# Patient Record
Sex: Female | Born: 1970 | Race: White | Hispanic: No | Marital: Married | Smoking: Former smoker
Health system: Southern US, Community
[De-identification: ages and names within clinical notes are randomized; demographics above are authoritative.]

## PROBLEM LIST (undated history)

## (undated) DIAGNOSIS — F419 Anxiety disorder, unspecified: Secondary | ICD-10-CM

## (undated) DIAGNOSIS — G8929 Other chronic pain: Secondary | ICD-10-CM

## (undated) DIAGNOSIS — E222 Syndrome of inappropriate secretion of antidiuretic hormone: Secondary | ICD-10-CM

## (undated) DIAGNOSIS — E079 Disorder of thyroid, unspecified: Secondary | ICD-10-CM

## (undated) DIAGNOSIS — Z1379 Encounter for other screening for genetic and chromosomal anomalies: Secondary | ICD-10-CM

## (undated) HISTORY — DX: Other chronic pain: G89.29

## (undated) HISTORY — DX: Encounter for other screening for genetic and chromosomal anomalies: Z13.79

## (undated) HISTORY — DX: Anxiety disorder, unspecified: F41.9

## (undated) HISTORY — DX: Disorder of thyroid, unspecified: E07.9

## (undated) HISTORY — DX: Syndrome of inappropriate secretion of antidiuretic hormone: E22.2

---

## 2001-06-11 HISTORY — PX: TUBAL LIGATION: SHX77

## 2010-10-31 ENCOUNTER — Ambulatory Visit: Payer: Self-pay | Admitting: Internal Medicine

## 2010-12-10 ENCOUNTER — Emergency Department: Payer: Self-pay | Admitting: Emergency Medicine

## 2011-02-27 ENCOUNTER — Ambulatory Visit: Payer: Self-pay

## 2011-12-08 ENCOUNTER — Ambulatory Visit: Payer: Self-pay | Admitting: Family Medicine

## 2011-12-11 ENCOUNTER — Emergency Department: Payer: Self-pay | Admitting: Emergency Medicine

## 2011-12-11 LAB — URINALYSIS, COMPLETE
Bilirubin,UR: NEGATIVE
Ketone: NEGATIVE
Leukocyte Esterase: NEGATIVE
Nitrite: NEGATIVE
Protein: NEGATIVE
WBC UR: NONE SEEN /HPF (ref 0–5)

## 2011-12-11 LAB — COMPREHENSIVE METABOLIC PANEL
Alkaline Phosphatase: 86 U/L (ref 50–136)
Bilirubin,Total: 0.2 mg/dL (ref 0.2–1.0)
Calcium, Total: 8.6 mg/dL (ref 8.5–10.1)
Co2: 27 mmol/L (ref 21–32)
EGFR (African American): >60
EGFR (Non-African Amer.): >60
Potassium: 3.8 mmol/L (ref 3.5–5.1)
SGPT (ALT): 25 U/L
Sodium: 142 mmol/L (ref 136–145)
Total Protein: 6.9 g/dL (ref 6.4–8.2)

## 2011-12-11 LAB — CBC
HGB: 12.8 g/dL (ref 12.0–16.0)
MCH: 31.3 pg (ref 26.0–34.0)
MCHC: 33.7 g/dL (ref 32.0–36.0)
RDW: 13.4 % (ref 11.5–14.5)
WBC: 4.9 10*3/uL (ref 3.6–11.0)

## 2012-11-24 ENCOUNTER — Ambulatory Visit: Payer: Self-pay | Admitting: Family Medicine

## 2013-01-08 ENCOUNTER — Ambulatory Visit: Payer: Self-pay | Admitting: Family Medicine

## 2013-01-22 ENCOUNTER — Ambulatory Visit: Payer: Self-pay | Admitting: Family Medicine

## 2013-04-27 ENCOUNTER — Ambulatory Visit: Payer: Self-pay | Admitting: Physician Assistant

## 2013-07-11 DIAGNOSIS — Z1379 Encounter for other screening for genetic and chromosomal anomalies: Secondary | ICD-10-CM

## 2013-07-11 HISTORY — DX: Encounter for other screening for genetic and chromosomal anomalies: Z13.79

## 2013-10-20 ENCOUNTER — Ambulatory Visit: Payer: Self-pay | Admitting: Family Medicine

## 2014-04-22 ENCOUNTER — Ambulatory Visit: Payer: Self-pay | Admitting: Family Medicine

## 2014-05-19 ENCOUNTER — Ambulatory Visit: Payer: Self-pay | Admitting: General Surgery

## 2014-05-19 ENCOUNTER — Encounter: Payer: Self-pay | Admitting: *Deleted

## 2014-05-27 ENCOUNTER — Ambulatory Visit (INDEPENDENT_AMBULATORY_CARE_PROVIDER_SITE_OTHER): Payer: 59 | Admitting: General Surgery

## 2014-05-27 ENCOUNTER — Encounter: Payer: Self-pay | Admitting: General Surgery

## 2014-05-27 VITALS — BP 130/70 | HR 74 | Resp 14 | Ht 63.0 in | Wt 224.0 lb

## 2014-05-27 DIAGNOSIS — Z803 Family history of malignant neoplasm of breast: Secondary | ICD-10-CM

## 2014-05-27 NOTE — Progress Notes (Signed)
Patient ID: Donna Yoder, female   DOB: 03/20/1971, 43 y.o.   MRN: [id number]  Chief Complaint  Patient presents with  . Other    mammogram     HPI Donna Yoder is a 43 y.o. female who presents for a breast evaluation. The most recent mammogram was done on 04/22/14. Patient does perform regular self breast checks and gets regular mammograms done. The patient states she has been genetically tested and tested positive for both BRCA 1 and 2 but she has no record of it. By report this was completed in [place], [place] in the 1990s. She thinks she has a very strong family history of breast cancer, reporting her sister, mother, maternal aunt and maternal grandmother have had breast cancer.  On further questioning, her sister had a mass the size of a lemon removed from her breast at age 19. She received no additional treatment, chemotherapy, radiation or hormonal therapy and is well her 40s. 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 The best I can tell, her mother had breast cancer in her 34s, and her maternal grandmother had cancer in her 75s.    The patient reports that she had the onset of menarche at age 72 and had her first live birth at age 61.  The patient is accompanied by her wife, Crystal.     HPI  Past Medical History  Diagnosis Date  . ADH disorder   . Anxiety   . Thyroid disease   . Chronic pain     Past Surgical History  Procedure Laterality Date  . Tubal ligation  2003    Family History  Problem Relation Age of Onset  . Cancer Mother 2    breast  . Cancer Father     prostate  . Cancer Maternal Aunt 59    breast  . Cancer Maternal Grandmother 70    breast    Social History History  Substance Use Topics  . Smoking status: Former Smoker -- 1.00 packs/day for 21 years  . Smokeless tobacco: Never Used  . Alcohol Use: Yes    Allergies  Allergen Reactions  . Ivp Dye [Iodinated Diagnostic  Agents] Anaphylaxis  . Latex Rash    Current Outpatient Prescriptions  Medication Sig Dispense Refill  . ALPRAZolam (XANAX) 1 MG tablet   1  . Ascorbic Acid (VITAMIN C PO) Take by mouth.    . B Complex Vitamins (VITAMIN B COMPLEX PO) Take by mouth.    . Calcium Carbonate-Vitamin D (CALCIUM + D PO) Take by mouth.    . docusate sodium (COLACE) 100 MG capsule Take 100 mg by mouth 2 (two) times daily.    . ferrous sulfate 325 (65 FE) MG tablet Take 325 mg by mouth daily with breakfast.    . Lactobacillus (ACIDOPHILUS PO) Take by mouth.    . levothyroxine (SYNTHROID, LEVOTHROID) 50 MCG tablet Take 50 mcg by mouth daily.  11  . methylphenidate (RITALIN) 20 MG tablet Take 20 mg by mouth 3 (three) times daily.  0  . Multiple Vitamins-Minerals (ZINC PO) Take by mouth.    . Omega-3 Fatty Acids (FP FISH OIL PO) Take by mouth.    . oxyCODONE-acetaminophen (PERCOCET/ROXICET) 5-325 MG per tablet   0   No current facility-administered medications for this visit.    Review of Systems Review of Systems  Constitutional: Negative.   Respiratory: Negative.   Cardiovascular: Negative.     Blood  pressure 130/70, pulse 74, resp. rate 14, height 5' 3"  (1.6 m), weight 224 lb (101.606 kg), last menstrual period 05/25/2014.  Physical Exam Physical Exam  Constitutional: She is oriented to person, place, and time. She appears well-developed and well-nourished.  Neck: Neck supple. No thyromegaly present.  Cardiovascular: Normal rate, regular rhythm and normal heart sounds.   No murmur heard. Pulmonary/Chest: Effort normal and breath sounds normal. Right breast exhibits no inverted nipple, no mass, no nipple discharge, no skin change and no tenderness. Left breast exhibits no inverted nipple, no mass, no nipple discharge, no skin change and no tenderness.  Lymphadenopathy:    She has no cervical adenopathy.    She has no axillary adenopathy.  Neurological: She is alert and oriented to person, place, and  time.  Skin: Skin is warm and dry.    Data Reviewed Most recent bilateral diagnostic mammogram of 04/22/2014 showed "no suspicious masses, calcifications or other abnormalities is identified within either breasts". BI-RADS-3 based on history of BRCA positive. Recommendation for consideration of MRI.  Assessment    Benign breast exam.  Possible BRCA-positive patient who would benefit from prophylactic mastectomy.    Plan    The patient was convinced that she wanted bilateral mastectomies with immediate reconstruction. In good conscience, I cannot consider this deforming surgery based on family history alone, anaerobic necessary to repeat her BRCA testing to confirm what she has been told in the past of being BRCA 1/2 positive. If indeed this is true, she would be a candidate for bilateral oophorectomy as well as mastectomy.  Review of her mammogram shows essentially fatty replaced breasts for which mammograms are very sensitive. Based on the [place] recommendations would not be inappropriate to do at least biannual screening MRIs if she does not proceed to mastectomy.  She is amenable to repeat testing and this would be completed next week with the nurse. She is aware that we'll be 2-3 weeks before the test results are available.    PCP:  Magnus Sinning 05/29/2014, 8:00 AM

## 2014-05-27 NOTE — Patient Instructions (Signed)
Patient to return for BRCA testing with [facility]. The patient is aware to call back for any questions or concerns.

## 2014-05-29 DIAGNOSIS — Z803 Family history of malignant neoplasm of breast: Secondary | ICD-10-CM | POA: Insufficient documentation

## 2014-06-02 ENCOUNTER — Ambulatory Visit: Payer: 59

## 2014-06-05 ENCOUNTER — Emergency Department: Payer: Self-pay | Admitting: Emergency Medicine

## 2014-06-05 LAB — URINALYSIS, COMPLETE
Bilirubin,UR: NEGATIVE
Blood: NEGATIVE
GLUCOSE, UR: NEGATIVE mg/dL (ref 0–75)
Ketone: NEGATIVE
Leukocyte Esterase: NEGATIVE
Nitrite: NEGATIVE
PH: 7 (ref 4.5–8.0)
Protein: NEGATIVE
RBC,UR: <1 /HPF (ref 0–5)
Specific Gravity: 1.019 (ref 1.003–1.030)
WBC UR: <1 /HPF (ref 0–5)

## 2014-06-05 LAB — BASIC METABOLIC PANEL
ANION GAP: 5 — AB (ref 7–16)
BUN: 16 mg/dL (ref 7–18)
CREATININE: 0.94 mg/dL (ref 0.60–1.30)
Calcium, Total: 8.5 mg/dL (ref 8.5–10.1)
Chloride: 104 mmol/L (ref 98–107)
Co2: 31 mmol/L (ref 21–32)
EGFR (Non-African Amer.): >60
Glucose: 105 mg/dL — ABNORMAL HIGH (ref 65–99)
OSMOLALITY: 281 (ref 275–301)
POTASSIUM: 4 mmol/L (ref 3.5–5.1)
Sodium: 140 mmol/L (ref 136–145)

## 2014-06-05 LAB — CBC
HCT: 40.1 % (ref 35.0–47.0)
HGB: 13.2 g/dL (ref 12.0–16.0)
MCH: 30.3 pg (ref 26.0–34.0)
MCHC: 32.9 g/dL (ref 32.0–36.0)
MCV: 92 fL (ref 80–100)
Platelet: 233 10*3/uL (ref 150–440)
RBC: 4.35 10*6/uL (ref 3.80–5.20)
RDW: 13.8 % (ref 11.5–14.5)
WBC: 12.2 10*3/uL — AB (ref 3.6–11.0)

## 2014-06-05 LAB — D-DIMER(ARMC): D-Dimer: 465 ng/ml

## 2014-06-05 LAB — TROPONIN I

## 2014-06-09 ENCOUNTER — Ambulatory Visit: Payer: Self-pay

## 2014-06-09 ENCOUNTER — Telehealth: Payer: Self-pay | Admitting: *Deleted

## 2014-06-09 ENCOUNTER — Ambulatory Visit: Payer: 59

## 2014-06-09 NOTE — Telephone Encounter (Signed)
She missed appointment for her BRCA testing.  R/S appointment ?

## 2014-06-10 ENCOUNTER — Ambulatory Visit: Payer: 59

## 2014-06-29 ENCOUNTER — Encounter: Payer: Self-pay | Admitting: General Surgery

## 2014-06-29 NOTE — Progress Notes (Signed)
BRCA testing from [facility] showed no clinically significant mutations. Patient is at average risk for breast cancer.  No indication for prophylactic mastectomy at this time.   With identification of fatty replaced breast, and a benign clinical exam, no surgical intervention is needed at this time.  The patient will be notified of the test results in the morning.  We will arrange for an office visit to review the test results into detail screening protocol in the near future.

## 2014-06-30 ENCOUNTER — Telehealth: Payer: Self-pay | Admitting: *Deleted

## 2014-06-30 NOTE — Telephone Encounter (Signed)
Notified patient as instructed, patient pleased. Discussed follow-up appointments on an as needed basis, patient agrees. She will continue her annual mammograms and breast checks with her PCP. She is aware we will be mailing her results to her, pt agrees..Marland Kitchen

## 2014-06-30 NOTE — Telephone Encounter (Signed)
-----   Message from Donna MayotteJeffrey W Byrnett, MD sent at 06/29/2014  6:30 PM EST ----- The patient is welcome to come in for an office visit to review these test results if she desires. Otherwise, annual screening mammograms are appropriate.

## 2014-07-05 ENCOUNTER — Encounter: Payer: Self-pay | Admitting: General Surgery

## 2017-01-23 ENCOUNTER — Ambulatory Visit
Admission: EM | Admit: 2017-01-23 | Discharge: 2017-01-23 | Disposition: A | Discharge status: Home / Self Care | Payer: No Typology Code available for payment source | Attending: Family Medicine | Admitting: Family Medicine

## 2017-01-23 ENCOUNTER — Encounter: Payer: Self-pay | Admitting: Emergency Medicine

## 2017-01-23 DIAGNOSIS — B349 Viral infection, unspecified: Secondary | ICD-10-CM | POA: Diagnosis not present

## 2017-01-23 DIAGNOSIS — J029 Acute pharyngitis, unspecified: Secondary | ICD-10-CM | POA: Diagnosis not present

## 2017-01-23 LAB — RAPID STREP SCREEN (MED CTR MEBANE ONLY): Streptococcus, Group A Screen (Direct): NEGATIVE

## 2017-01-23 NOTE — ED Provider Notes (Signed)
[facility]    CSN: [id number] Arrival date & time: 01/23/17  1512     History   Chief Complaint Chief Complaint  Patient presents with  . Sore Throat    HPI Nile Dorning is a 46 y.o. female.   46 yo female with a c/o sore throat since this morning. Pain with swallowing. Denies any fevers, chills, cough, earache, runny nose/congestion. 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    The history is provided by the patient.  Sore Throat  This is a new problem. The current episode started 6 to 12 hours ago. The problem occurs constantly. The problem has not changed since onset.Pertinent negatives include no chest pain, no abdominal pain, no headaches and no shortness of breath.    Past Medical History:  Diagnosis Date  . ADH disorder (HCC)   . Anxiety   . Chronic pain   . Thyroid disease     Patient Active Problem List   Diagnosis Date Noted  . Family history of breast cancer 05/29/2014    Past Surgical History:  Procedure Laterality Date  . TUBAL LIGATION  2003    OB History    Gravida Para Term Preterm AB Living   5 3     2 3    SAB TAB Ectopic Multiple Live Births   2              Obstetric Comments   1st Menstrual Cycle: 9 1st Pregnancy:  20        Home Medications    Prior to Admission medications   Medication Sig Start Date End Date Taking? Authorizing Provider  ALPRAZolam Prudy Feeler) 1 MG tablet  04/23/14   [provider]  methylphenidate (RITALIN) 20 MG tablet Take 20 mg by mouth 3 (three) times daily. 05/08/14   [provider]  oxyCODONE-acetaminophen (PERCOCET/ROXICET) 5-325 MG per tablet  03/24/14   [provider]    Family History Family History  Problem Relation Age of Onset  . Cancer Mother 25       breast  . Cancer Father        prostate  . Cancer Maternal Aunt 35       breast  . Cancer Maternal Grandmother 20       breast    Social History Social History    Substance Use Topics  . Smoking status: Former Smoker    Packs/day: 1.00    Years: 21.00  . Smokeless tobacco: Never Used  . Alcohol use Yes     Allergies   Ivp dye [iodinated diagnostic agents] and Latex   Review of Systems Review of Systems  Respiratory: Negative for shortness of breath.   Cardiovascular: Negative for chest pain.  Gastrointestinal: Negative for abdominal pain.  Neurological: Negative for headaches.     Physical Exam Triage Vital Signs ED Triage Vitals  Enc Vitals Group     BP 01/23/17 1538 112/71     Pulse Rate 01/23/17 1538 77     Resp 01/23/17 1538 16     Temp 01/23/17 1538 98.7 F (37.1 C)     Temp Source 01/23/17 1538 Oral     SpO2 01/23/17 1538 96 %     Weight 01/23/17 1535 250 lb (113.4 kg)     Height 01/23/17 1535 5\' 3"  (1.6 m)     Head Circumference --      Peak Flow --      Pain  Score 01/23/17 1535 6     Pain Loc --      Pain Edu? --      Excl. in GC? --    No data found.   Updated Vital Signs BP 112/71 (BP Location: Left Arm)   Pulse 77   Temp 98.7 F (37.1 C) (Oral)   Resp 16   Ht 5\' 3"  (1.6 m)   Wt 250 lb (113.4 kg)   LMP 12/06/2016   SpO2 96%   BMI 44.29 kg/m   Visual Acuity Right Eye Distance:   Left Eye Distance:   Bilateral Distance:    Right Eye Near:   Left Eye Near:    Bilateral Near:     Physical Exam  Constitutional: She appears well-developed and well-nourished. No distress.  HENT:  Head: Normocephalic and atraumatic.  Right Ear: Tympanic membrane, external ear and ear canal normal.  Left Ear: Tympanic membrane, external ear and ear canal normal.  Mouth/Throat: Uvula is midline and mucous membranes are normal. Posterior oropharyngeal erythema present. No oropharyngeal exudate, posterior oropharyngeal edema or tonsillar abscesses. No tonsillar exudate.  Neck: Normal range of motion. Neck supple. No thyromegaly present.  Cardiovascular: Normal rate.   Pulmonary/Chest: Effort normal. No respiratory  distress.  Lymphadenopathy:    She has no cervical adenopathy.  Skin: She is not diaphoretic.  Nursing note and vitals reviewed.    UC Treatments / Results  Labs (all labs ordered are listed, but only abnormal results are displayed) Labs Reviewed  RAPID STREP SCREEN (NOT AT [facility])  CULTURE, GROUP A STREP [facility])    EKG  EKG Interpretation None       Radiology No results found.  Procedures Procedures (including critical care time)  Medications Ordered in UC Medications - No data to display   Initial Impression / Assessment and Plan / UC Course  I have reviewed the triage vital signs and the nursing notes.  Pertinent labs & imaging results that were available during my care of the patient were reviewed by me and considered in my medical decision making (see chart for details).      Final Clinical Impressions(s) / UC Diagnoses   Final diagnoses:  Viral pharyngitis    New Prescriptions Discharge Medication List as of 01/23/2017  4:10 PM     1. Lab results and diagnosis reviewed with patient 2.Recommend supportive treatment with otc analgesics, salt water gargles, increased fluids 3. Follow-up prn if symptoms worsen or don't improve Controlled Substance Prescriptions [place] Controlled Substance Registry consulted? Not Applicable   Payton Mccallumonty, Girtie Wiersma, MD 01/23/17 [phone]

## 2017-01-23 NOTE — ED Triage Notes (Signed)
Patient c/o sore throat that started this morning.

## 2017-01-23 NOTE — Discharge Instructions (Signed)
Increased water intake, salt water gargles, over the counter tylenol or advil

## 2017-01-25 ENCOUNTER — Telehealth: Payer: Self-pay

## 2017-01-25 NOTE — Telephone Encounter (Signed)
Pt called stating she was seen on 8/15 and wanted an extended doctors note for another day being out and I told her we were unable to do that due to [facility] and that if she's not feeling better she should come back. Pt understood but was not happy about it.

## 2017-01-26 LAB — CULTURE, GROUP A STREP (THRC)

## 2017-07-25 ENCOUNTER — Encounter: Payer: Self-pay | Admitting: *Deleted

## 2018-06-11 HISTORY — PX: REPLACEMENT TOTAL KNEE: SUR1224

## 2019-05-16 ENCOUNTER — Emergency Department
Admission: EM | Admit: 2019-05-16 | Discharge: 2019-05-16 | Disposition: A | Discharge status: Home / Self Care | Payer: 59 | Attending: Emergency Medicine | Admitting: Emergency Medicine

## 2019-05-16 ENCOUNTER — Encounter: Payer: Self-pay | Admitting: Emergency Medicine

## 2019-05-16 ENCOUNTER — Other Ambulatory Visit: Payer: Self-pay

## 2019-05-16 DIAGNOSIS — Z91040 Latex allergy status: Secondary | ICD-10-CM | POA: Insufficient documentation

## 2019-05-16 DIAGNOSIS — Z79899 Other long term (current) drug therapy: Secondary | ICD-10-CM | POA: Diagnosis not present

## 2019-05-16 DIAGNOSIS — Z87891 Personal history of nicotine dependence: Secondary | ICD-10-CM | POA: Diagnosis not present

## 2019-05-16 DIAGNOSIS — Y939 Activity, unspecified: Secondary | ICD-10-CM | POA: Diagnosis not present

## 2019-05-16 DIAGNOSIS — Y929 Unspecified place or not applicable: Secondary | ICD-10-CM | POA: Insufficient documentation

## 2019-05-16 DIAGNOSIS — Y999 Unspecified external cause status: Secondary | ICD-10-CM | POA: Insufficient documentation

## 2019-05-16 DIAGNOSIS — Z23 Encounter for immunization: Secondary | ICD-10-CM | POA: Diagnosis not present

## 2019-05-16 DIAGNOSIS — S0502XA Injury of conjunctiva and corneal abrasion without foreign body, left eye, initial encounter: Secondary | ICD-10-CM | POA: Insufficient documentation

## 2019-05-16 DIAGNOSIS — S0592XA Unspecified injury of left eye and orbit, initial encounter: Secondary | ICD-10-CM | POA: Diagnosis present

## 2019-05-16 DIAGNOSIS — Y33XXXA Other specified events, undetermined intent, initial encounter: Secondary | ICD-10-CM | POA: Diagnosis not present

## 2019-05-16 MED ORDER — FLUORESCEIN SODIUM 1 MG OP STRP
1.0000 | ORAL_STRIP | Freq: Once | OPHTHALMIC | Status: AC
  Administered 2019-05-16: 1 via OPHTHALMIC
  Filled 2019-05-16: qty 1

## 2019-05-16 MED ORDER — TETRACAINE HCL 0.5 % OP SOLN
1.0000 [drp] | Freq: Once | OPHTHALMIC | Status: AC
  Administered 2019-05-16: 1 [drp] via OPHTHALMIC
  Filled 2019-05-16: qty 4

## 2019-05-16 MED ORDER — TETANUS-DIPHTH-ACELL PERTUSSIS 5-2.5-18.5 LF-MCG/0.5 IM SUSP
0.5000 mL | Freq: Once | INTRAMUSCULAR | Status: AC
  Administered 2019-05-16: 0.5 mL via INTRAMUSCULAR
  Filled 2019-05-16: qty 0.5

## 2019-05-16 MED ORDER — ERYTHROMYCIN 5 MG/GM OP OINT: 1.0000 "application " | TOPICAL_OINTMENT | Freq: Every day | OPHTHALMIC | 0 refills | Status: DC

## 2019-05-16 NOTE — ED Provider Notes (Signed)
[facility] Emergency Department Provider Note  ____________________________________________  Time seen: Approximately 9:30 PM  I have reviewed the triage vital signs and the nursing notes.   HISTORY  Chief Complaint Foreign Body    HPI Donna Yoder is a 48 y.o. female presents to the emergency department with acute onset of left eye pain.  Patient reports that she was sitting on the couch and suddenly felt a sharp pain in her left eye.  Patient states that she rubbed her left eye vigorously and has since had pain.  She denies nausea or vision loss.  No blurry vision.  She has had increased tearing since incident occurred.  She denies similar issues in the past.  Patient states that she does wear contact lenses.        Past Medical History:  Diagnosis Date  . ADH disorder (HCC)   . Anxiety   . Chronic pain   . Genetic screening 07/11/2013   negative/ [facility]  . Thyroid disease     Patient Active Problem List   Diagnosis Date Noted  . Family history of breast cancer 05/29/2014    Past Surgical History:  Procedure Laterality Date  . TUBAL LIGATION  2003    Prior to Admission medications   Medication Sig Start Date End Date Taking? Authorizing Provider  ALPRAZolam (XANAX) 1 MG tablet  04/23/14   [provider]  erythromycin ophthalmic ointment Place 1 application into the left eye at bedtime. 05/16/19   Woods, Jaclyn M, PA-C  methylphenidate (RITALIN) 20 MG tablet Take 20 mg by mouth 3 (three) times daily. 05/08/14   [provider]  oxyCODONE-acetaminophen (PERCOCET/ROXICET) 5-325 MG per tablet  03/24/14   [provider]    Allergies Ivp dye [iodinated diagnostic agents] and Latex  Family History  Problem Relation Age of Onset  . Cancer Mother 40       breast  . Cancer Father        prostate  . Cancer Maternal Aunt 59       breast  . Cancer Maternal Grandmother 80       breast    Social  History Social History   Tobacco Use  . Smoking status: Former Smoker    Packs/day: 1.00    Years: 21.00    Pack years: 21.00  . Smokeless tobacco: Never Used  Substance Use Topics  . Alcohol use: Yes  . Drug use: No     Review of Systems  Constitutional: No fever/chills Eyes: Patient has left eye pain and increased tearing.  ENT: No upper respiratory complaints. Cardiovascular: no chest pain. Respiratory: no cough. No SOB. Gastrointestinal: No abdominal pain.  No nausea, no vomiting.  No diarrhea.  No constipation. Genitourinary: Negative for dysuria. No hematuria Musculoskeletal: Negative for musculoskeletal pain. Skin: Negative for rash, abrasions, lacerations, ecchymosis. Neurological: Negative for headaches, focal weakness or numbness.   ____________________________________________   PHYSICAL EXAM:  VITAL SIGNS: ED Triage Vitals  Enc Vitals Group     BP 05/16/19 2118 (!) 149/99     Pulse Rate 05/16/19 2118 92     Resp 05/16/19 2118 18     Temp 05/16/19 2118 98.7 F (37.1 C)     Temp Source 05/16/19 2118 Oral     SpO2 05/16/19 2118 97 %     Weight 05/16/19 2119 260 lb (117.9 kg)     Height 05/16/19 2119 5' 3" (1.6 m)     Head Circumference --        Peak Flow --      Pain Score 05/16/19 2119 10     Pain Loc --      Pain Edu? --      Excl. in GC? --      Constitutional: Alert and oriented. Well appearing and in no acute distress. Eyes: Patient has fluorescein uptake at the superior aspect of the left cornea.  PERRL. EOMI. Head: Atraumatic. ENT:      Nose: No congestion/rhinnorhea.      Mouth/Throat: Mucous membranes are moist.  Neck: No stridor.  No cervical spine tenderness to palpation. Cardiovascular: Normal rate, regular rhythm. Normal S1 and S2.  Good peripheral circulation. Respiratory: Normal respiratory effort without tachypnea or retractions. Lungs CTAB. Good air entry to the bases with no decreased or absent breath sounds. Musculoskeletal:  Full range of motion to all extremities. No gross deformities appreciated. Neurologic:  Normal speech and language. No gross focal neurologic deficits are appreciated.  Skin:  Skin is warm, dry and intact. No rash noted. Psychiatric: Mood and affect are normal. Speech and behavior are normal. Patient exhibits appropriate insight and judgement.   ____________________________________________   LABS (all labs ordered are listed, but only abnormal results are displayed)  Labs Reviewed - No data to display ____________________________________________  EKG   ____________________________________________  RADIOLOGY   No results found.  ____________________________________________    PROCEDURES  Procedure(s) performed:    Procedures    Medications  fluorescein ophthalmic strip 1 strip (has no administration in time range)  tetracaine (PONTOCAINE) 0.5 % ophthalmic solution 1 drop (has no administration in time range)  Tdap (BOOSTRIX) injection 0.5 mL (has no administration in time range)     ____________________________________________   INITIAL IMPRESSION / ASSESSMENT AND PLAN / ED COURSE  Pertinent labs & imaging results that were available during my care of the patient were reviewed by me and considered in my medical decision making (see chart for details).  Review of the [place] CSRS was performed in accordance of the [facility] prior to dispensing any controlled drugs.           Assessment and plan Corneal abrasion 48-year-old female presents to the emergency department with acute left eye pain.  Patient had a region of fluorescein uptake at the superior aspect of the left cornea consistent with a corneal abrasion.  There was no foreign body to be removed.  Patient was discharged with erythromycin ointment.  She reports that she currently takes oxycodone for chronic pain and does not need additional oral pain medications.  She was advised to follow-up with ophthalmology as  needed.  All patient questions were answered.     ____________________________________________  FINAL CLINICAL IMPRESSION(S) / ED DIAGNOSES  Final diagnoses:  Abrasion of left cornea, initial encounter      NEW MEDICATIONS STARTED DURING THIS VISIT:  ED Discharge Orders         Ordered    erythromycin ophthalmic ointment  Daily at bedtime     05/16/19 2126              This chart was dictated using voice recognition software/Dragon. Despite best efforts to proofread, errors can occur which can change the meaning. Any change was purely unintentional.    Woods, Jaclyn M, PA-C 05/16/19 2134    Quale, Mark, MD 05/17/19 0018  

## 2019-05-16 NOTE — ED Triage Notes (Signed)
Pt to rm 40 from waiting room. Pt reports pain to her left eye and "asomething on her eye". Redness and tearing noted to left eye.

## 2020-09-14 ENCOUNTER — Ambulatory Visit (INDEPENDENT_AMBULATORY_CARE_PROVIDER_SITE_OTHER): Payer: Self-pay

## 2020-09-14 ENCOUNTER — Other Ambulatory Visit: Payer: Self-pay

## 2020-09-14 ENCOUNTER — Ambulatory Visit
Admission: EM | Admit: 2020-09-14 | Discharge: 2020-09-14 | Disposition: A | Discharge status: Home / Self Care | Payer: Self-pay | Attending: Family Medicine | Admitting: Family Medicine

## 2020-09-14 ENCOUNTER — Ambulatory Visit: Payer: Self-pay

## 2020-09-14 DIAGNOSIS — S060X9A Concussion with loss of consciousness of unspecified duration, initial encounter: Secondary | ICD-10-CM

## 2020-09-14 DIAGNOSIS — F411 Generalized anxiety disorder: Secondary | ICD-10-CM

## 2020-09-14 DIAGNOSIS — M7918 Myalgia, other site: Secondary | ICD-10-CM

## 2020-09-14 DIAGNOSIS — R58 Hemorrhage, not elsewhere classified: Secondary | ICD-10-CM

## 2020-09-14 MED ORDER — ALPRAZOLAM 0.5 MG PO TABS: 0.5000 mg | ORAL_TABLET | Freq: Three times a day (TID) | ORAL | 0 refills | Status: AC | PRN

## 2020-09-14 NOTE — Discharge Instructions (Signed)
Rest.   Medication as prescribed.  Continue your home pain medication.  Take care  Dr. Adriana Simas
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
## 2023-01-09 IMAGING — CR DG KNEE COMPLETE 4+V*R*
4 series · 4 of 4 positions shown · non-contrast
Comparison: None.

CLINICAL DATA: Pain

EXAM:
RIGHT KNEE - COMPLETE 4+ VIEW

[knee ap]
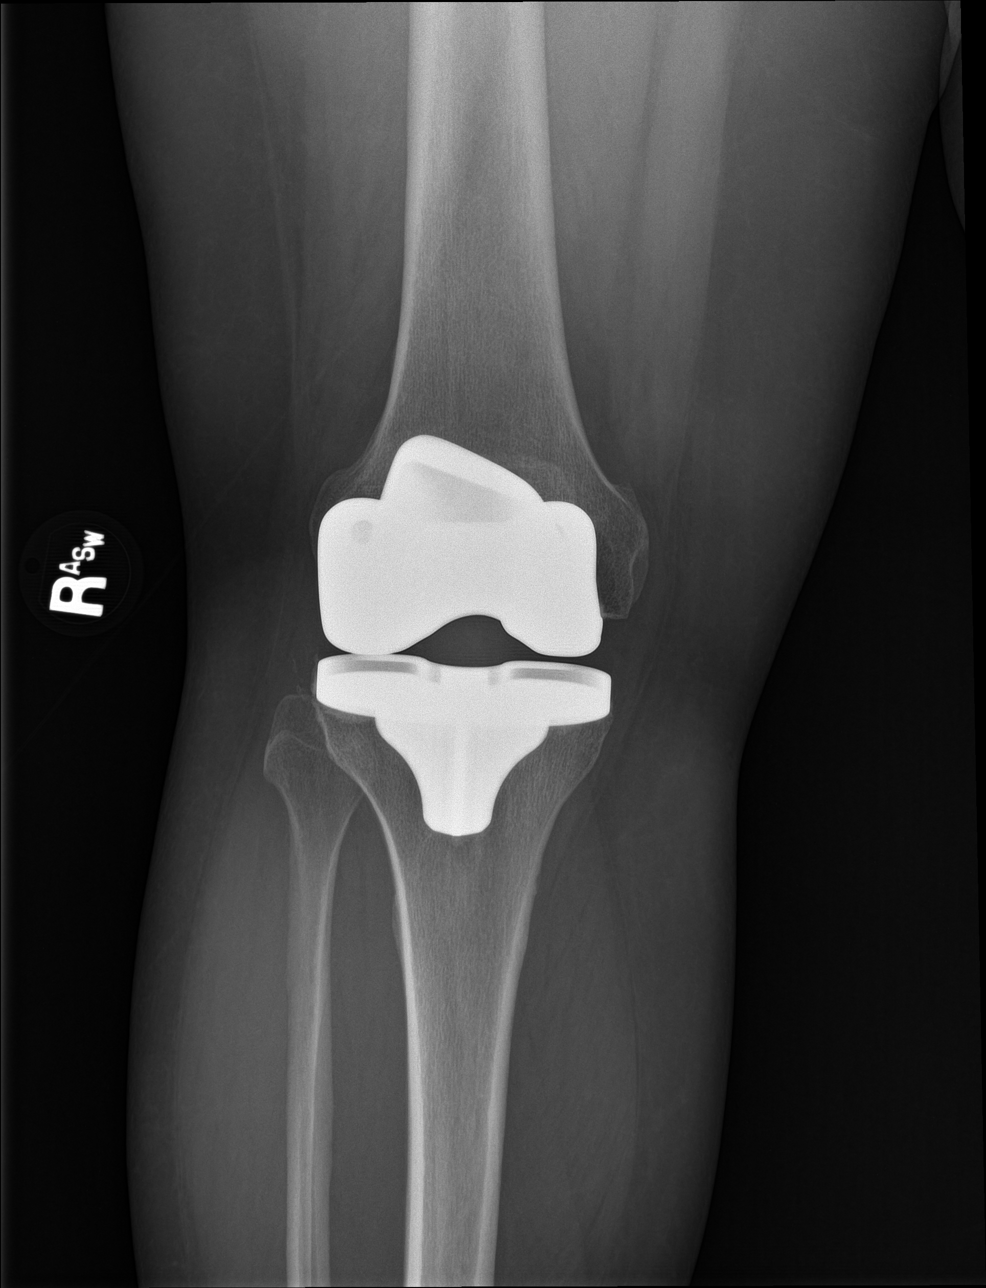

[knee lat]
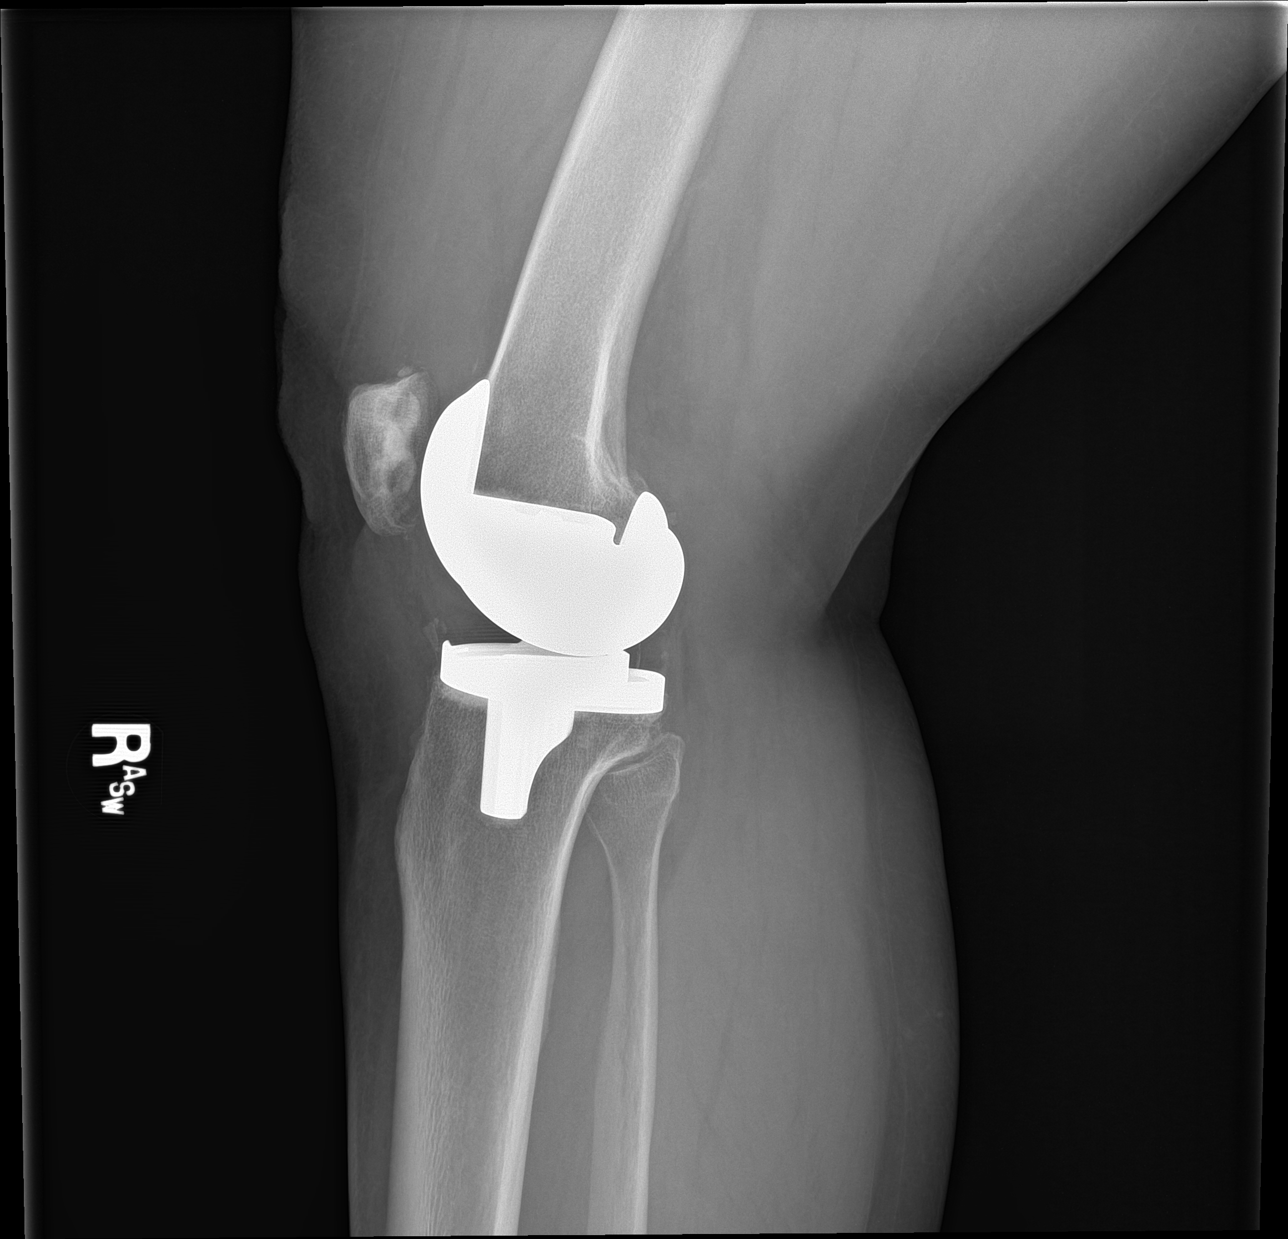

[knee obl (1 of 2)]
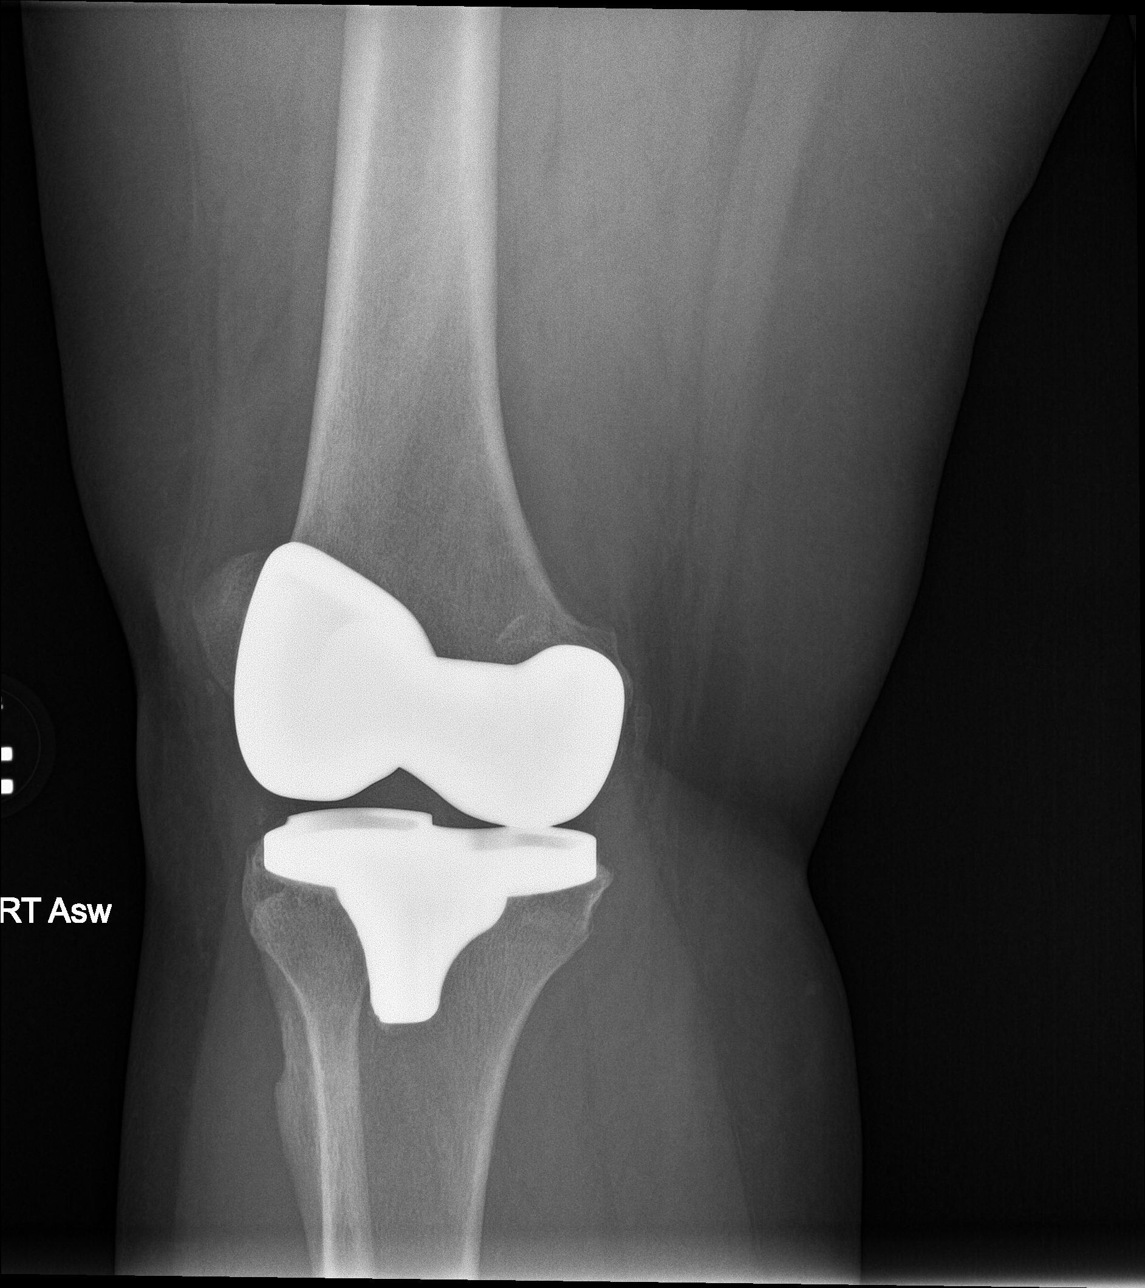

[knee obl (2 of 2)]
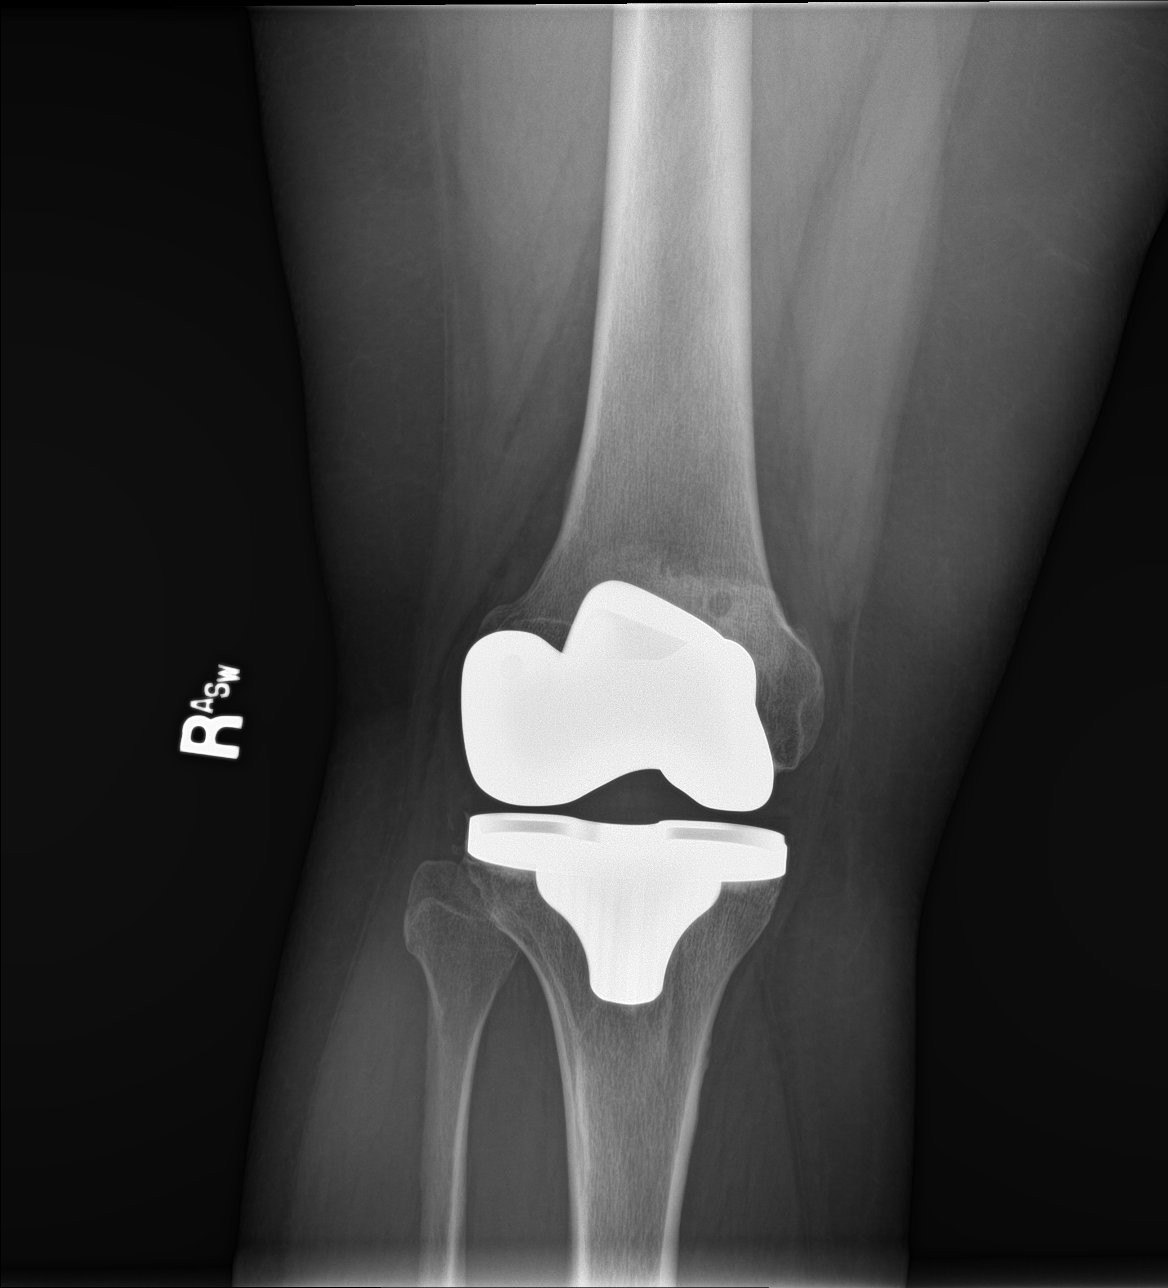

[4 of 4 positions shown; findings below may reference images not displayed]

FINDINGS: The patient is status post prior total knee arthroplasty. There is
no periprosthetic fracture. The hardware appears appropriately
aligned.
IMPRESSION: Negative.

## 2024-05-24 ENCOUNTER — Encounter (INDEPENDENT_AMBULATORY_CARE_PROVIDER_SITE_OTHER): Payer: Self-pay

## 2024-05-24 ENCOUNTER — Ambulatory Visit
Admission: EM | Admit: 2024-05-24 | Discharge: 2024-05-24 | Disposition: A | Discharge status: Home / Self Care | Source: Home / Self Care

## 2024-05-24 ENCOUNTER — Encounter: Payer: Self-pay | Admitting: Emergency Medicine

## 2024-05-24 DIAGNOSIS — R051 Acute cough: Secondary | ICD-10-CM

## 2024-05-24 DIAGNOSIS — Z720 Tobacco use: Secondary | ICD-10-CM

## 2024-05-24 LAB — POC SOFIA SARS ANTIGEN FIA: SARS Coronavirus 2 Ag: NEGATIVE

## 2024-05-24 MED ORDER — PROMETHAZINE-DM 6.25-15 MG/5ML PO SYRP: 5.0000 mL | ORAL_SOLUTION | Freq: Four times a day (QID) | ORAL | 0 refills | Status: AC | PRN

## 2024-05-24 NOTE — Discharge Instructions (Addendum)
 Check my chart for results of covid test.  Most likely you have a viral illness: no antibiotic is indicated at this time, May treat with OTC meds of choice. Make sure to drink plenty of fluids to stay hydrated(gatorade, water, popsicles,jello,etc), avoid caffeine products. Take phenergan  Dm as prescribed. Follow up with PCP.

## 2024-05-24 NOTE — ED Provider Notes (Signed)
 [facility]    CSN: [id number] Arrival date & time: 05/24/24  1535      History   Chief Complaint Chief Complaint  Patient presents with   Cough    HPI Donna Yoder is a 53 y.o. female.   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   Former cigarette smoker, now vapes  The history is provided by the patient. No language interpreter was used.  Cough Associated symptoms: chills, fever and myalgias     Past Medical History:  Diagnosis Date   ADH disorder    Anxiety    Chronic pain    Genetic screening 07/11/2013   negative/ [facility]   Thyroid disease     Patient Active Problem List   Diagnosis Date Noted   Acute cough 05/24/2024   Vapes nicotine containing substance 05/24/2024   Family history of breast cancer 05/29/2014    Past Surgical History:  Procedure Laterality Date   REPLACEMENT TOTAL KNEE Right 2020   TUBAL LIGATION  2003    OB History     Gravida  5   Para  3   Term      Preterm      AB  2   Living  3      SAB  2   IAB      Ectopic      Multiple      Live Births           Obstetric Comments  1st Menstrual Cycle: 9 1st Pregnancy:  20           Home Medications    Prior to Admission medications  Medication Sig Start Date End Date Taking? Authorizing Provider  promethazine -dextromethorphan (PROMETHAZINE -DM) 6.25-15 MG/5ML syrup Take 5 mLs by mouth 4 (four) times daily as needed for cough. 05/24/24  Yes Allan Bacigalupi, Rilla, NP  ALPRAZolam  (XANAX ) 0.5 MG tablet Take 1 tablet (0.5 mg total) by mouth 3 (three) times daily as needed for anxiety. 09/14/20   Cook, Jayce G, DO  Chlorzoxazone 375 MG TABS Lorzone 375 mg tablet  TAKE 1 TABLET BY MOUTH THREE TIMES A DAY    [provider]  gabapentin (NEURONTIN) 800 MG tablet gabapentin 800 mg tablet  TAKE 1  TABLET BY MOUTH 3 TIMES A DAY AS NEEDED FOR NEUROPATHIC PAIN    [provider]  meloxicam (MOBIC) 15 MG tablet meloxicam 15 mg tablet  TAKE 1 TABLET BY MOUTH EVERY DAY WITH A MEAL    [provider]  methylphenidate (RITALIN) 20 MG tablet Take 20 mg by mouth 3 (three) times daily. 05/08/14   [provider]  oxybutynin (DITROPAN) 5 MG tablet Take 5 mg by mouth 2 (two) times daily. 07/01/20   [provider]  oxyCODONE-acetaminophen (PERCOCET/ROXICET) 5-325 MG per tablet  03/24/14   [provider]  tiZANidine (ZANAFLEX) 4 MG tablet tizanidine 4 mg tablet  TAKE 1 TABLET BY MOUTH EVERY 6 HOURS AS NEEDED FOR CRAMPS    [provider]    Family History Family History  Problem Relation Age of Onset   Cancer Mother 73       breast   Cancer Father        prostate   Cancer Maternal Aunt 30       breast   Cancer Maternal  Grandmother 12       breast    Social History Social History[1]   Allergies   Ivp dye [iodinated contrast media], Polyvinyl chloride, and Latex   Review of Systems Review of Systems  Constitutional:  Positive for chills and fever.  HENT:  Positive for congestion.   Respiratory:  Positive for cough.   Musculoskeletal:  Positive for myalgias.  All other systems reviewed and are negative.    Physical Exam Triage Vital Signs ED Triage Vitals  Encounter Vitals Group     BP 05/24/24 1553 (!) 152/100     Girls Systolic BP Percentile --      Girls Diastolic BP Percentile --      Boys Systolic BP Percentile --      Boys Diastolic BP Percentile --      Pulse Rate 05/24/24 1553 75     Resp 05/24/24 1553 15     Temp 05/24/24 1553 97.7 F (36.5 C)     Temp Source 05/24/24 1553 Oral     SpO2 05/24/24 1553 100 %     Weight 05/24/24 1552 235 lb 0.2 oz (106.6 kg)     Height 05/24/24 1552 5' 3 (1.6 m)     Head Circumference --      Peak Flow --      Pain Score 05/24/24 1552 0     Pain Loc --      Pain Education  --      Exclude from Growth Chart --    No data found.  Updated Vital Signs BP (!) 152/100 (BP Location: Left Arm)   Pulse 75   Temp 97.7 F (36.5 C) (Oral)   Resp 15   Ht 5' 3 (1.6 m)   Wt 235 lb 0.2 oz (106.6 kg)   LMP 12/06/2016   SpO2 100%   BMI 41.63 kg/m   Visual Acuity Right Eye Distance:   Left Eye Distance:   Bilateral Distance:    Right Eye Near:   Left Eye Near:    Bilateral Near:     Physical Exam Vitals and nursing note reviewed.  Constitutional:      General: She is not in acute distress.    Appearance: She is well-developed and well-groomed.  HENT:     Head: Normocephalic.     Right Ear: Tympanic membrane is retracted.     Left Ear: Tympanic membrane is retracted.     Nose: Congestion present.     Mouth/Throat:     Lips: Pink.     Mouth: Mucous membranes are moist.     Pharynx: Oropharynx is clear. Uvula midline.  Eyes:     General: Lids are normal.     Conjunctiva/sclera: Conjunctivae normal.     Pupils: Pupils are equal, round, and reactive to light.  Neck:     Trachea: No tracheal deviation.  Cardiovascular:     Rate and Rhythm: Normal rate and regular rhythm.     Heart sounds: Normal heart sounds. No murmur heard. Pulmonary:     Effort: Pulmonary effort is normal.     Breath sounds: Normal breath sounds and air entry.  Abdominal:     General: Bowel sounds are normal.     Palpations: Abdomen is soft.     Tenderness: There is no abdominal tenderness.  Musculoskeletal:        General: Normal range of motion.     Cervical back: Normal range of motion.  Lymphadenopathy:     Cervical:  No cervical adenopathy.  Skin:    General: Skin is warm and dry.     Findings: No rash.  Neurological:     General: No focal deficit present.     Mental Status: She is alert and oriented to person, place, and time.     GCS: GCS eye subscore is 4. GCS verbal subscore is 5. GCS motor subscore is 6.  Psychiatric:        Speech: Speech normal.         Behavior: Behavior normal. Behavior is cooperative.      UC Treatments / Results  Labs (all labs ordered are listed, but only abnormal results are displayed) Labs Reviewed  POC SOFIA SARS ANTIGEN FIA - Normal    EKG   Radiology No results found.  Procedures Procedures (including critical care time)  Medications Ordered in UC Medications - No data to display  Initial Impression / Assessment and Plan / UC Course  I have reviewed the triage vital signs and the nursing notes.  Pertinent labs & imaging results that were available during my care of the patient were reviewed by me and considered in my medical decision making (see chart for details).    Discussed exam findings and plan of care with patient, Phenergan  DM scripted for cough , strict go to ER precautions given.   Patient verbalized understanding to this provider.  Work note given  Ddx: Viral URI with cough, vape use, allergies, sinusitis Final Clinical Impressions(s) / UC Diagnoses   Final diagnoses:  Acute cough  Vapes nicotine containing substance     Discharge Instructions      Check my chart for results of covid test.  Most likely you have a viral illness: no antibiotic is indicated at this time, May treat with OTC meds of choice. Make sure to drink plenty of fluids to stay hydrated(gatorade, water, popsicles,jello,etc), avoid caffeine products. Take phenergan  Dm as prescribed. Follow up with PCP.      ED Prescriptions     Medication Sig Dispense Auth. Provider   promethazine -dextromethorphan (PROMETHAZINE -DM) 6.25-15 MG/5ML syrup Take 5 mLs by mouth 4 (four) times daily as needed for cough. 118 mL Loriana Samad, NP      PDMP not reviewed this encounter.     [1]  Social History Tobacco Use   Smoking status: Former    Current packs/day: 1.00    Average packs/day: 1 pack/day for 21.0 years (21.0 ttl pk-yrs)    Types: Cigarettes   Smokeless tobacco: Never  Vaping Use   Vaping status:  Every Day  Substance Use Topics   Alcohol use: Yes   Drug use: No     Mckinzy Fuller, Rilla, NP 05/24/24 1636

## 2024-05-24 NOTE — ED Triage Notes (Signed)
 Patient c/o cough and chest congestion for 4-5 days. Patient reports fever, bodyaches and chills.
